# Patient Record
Sex: Male | Born: 1990 | Race: Black or African American | Hispanic: No | Marital: Single | Smoking: Current every day smoker
Health system: Southern US, Community
[De-identification: ages and names within clinical notes are randomized; demographics above are authoritative.]

---
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
## 2014-07-19 ENCOUNTER — Encounter (HOSPITAL_COMMUNITY): Payer: Self-pay | Admitting: Emergency Medicine

## 2014-07-19 ENCOUNTER — Emergency Department (HOSPITAL_COMMUNITY)
Admission: EM | Admit: 2014-07-19 | Discharge: 2014-07-19 | Disposition: A | Discharge status: Home / Self Care | Payer: Self-pay | Attending: Emergency Medicine | Admitting: Emergency Medicine

## 2014-07-19 DIAGNOSIS — J018 Other acute sinusitis: Secondary | ICD-10-CM | POA: Insufficient documentation

## 2014-07-19 DIAGNOSIS — M542 Cervicalgia: Secondary | ICD-10-CM | POA: Insufficient documentation

## 2014-07-19 DIAGNOSIS — H9209 Otalgia, unspecified ear: Secondary | ICD-10-CM | POA: Insufficient documentation

## 2014-07-19 DIAGNOSIS — J069 Acute upper respiratory infection, unspecified: Secondary | ICD-10-CM | POA: Insufficient documentation

## 2014-07-19 DIAGNOSIS — Z720 Tobacco use: Secondary | ICD-10-CM | POA: Insufficient documentation

## 2014-07-19 MED ORDER — AMOXICILLIN 500 MG PO CAPS: 500.0000 mg | ORAL_CAPSULE | Freq: Three times a day (TID) | ORAL | Status: DC

## 2014-07-19 MED ORDER — LORATADINE-PSEUDOEPHEDRINE ER 5-120 MG PO TB12: 1.0000 | ORAL_TABLET | Freq: Two times a day (BID) | ORAL | Status: DC

## 2014-07-19 MED ORDER — ACETAMINOPHEN-CODEINE #3 300-30 MG PO TABS
1.0000 | ORAL_TABLET | Freq: Four times a day (QID) | ORAL | Status: DC | PRN
Start: 2014-07-19 — End: 2015-11-30

## 2014-07-19 MED ORDER — PREDNISONE 10 MG PO TABS: ORAL_TABLET | ORAL | Status: DC

## 2014-07-19 NOTE — ED Notes (Signed)
Patient given discharge instruction, verbalized understand. Patient ambulatory out of the department.  

## 2014-07-19 NOTE — ED Provider Notes (Signed)
CSN: [id number]     Arrival date & time 07/19/14  0945 History  This chart was scribed for non-physician practitioner, Ivery Quale, PA-C working with Juliet Rude. Rubin Payor, MD found found by Gwenyth Ober, ED scribe. This patient was seen in room APA09/APA09 and the patient's care was started at 11:28 AM   Chief Complaint  Patient presents with  . Nasal Congestion   Patient is a 24 y.o. male presenting with URI. The history is provided by the patient. No language interpreter was used.  URI Presenting symptoms: congestion, ear pain, facial pain, fever and sore throat   Severity:  Moderate Onset quality:  Gradual Duration:  1 week Timing:  Constant Progression:  Unchanged Chronicity:  New Relieved by:  Nothing Ineffective treatments:  Decongestant Associated symptoms: headaches, neck pain and sinus pain     HPI Comments: Riley Ali is a 24 y.o. male with no chronic medical conditions who presents to the Emergency Department complaining of constant pressure-like head ache and sinus congestion that started 1 week ago. Pt states sore throat, neck pain, ear pain, greenish-yellow nasal drainage, fever of 36F, and 1 episode of diarrhea that occurred yesterday as associated symptoms. He has tried Mucinex and Advil with no relief. He denies recent head injury. Pt also denies vomiting and visual changes as associated symptoms.   History reviewed. No pertinent past medical history. History reviewed. No pertinent past surgical history. No family history on file. History  Substance Use Topics  . Smoking status: Current Every Day Smoker -- 0.50 packs/day    Types: Cigarettes  . Smokeless tobacco: Not on file  . Alcohol Use: Yes     Comment: rarely    Review of Systems  Constitutional: Positive for fever.  HENT: Positive for congestion, ear pain, sinus pressure and sore throat.   Eyes: Negative for visual disturbance.  Gastrointestinal: Positive for diarrhea. Negative for vomiting.   Musculoskeletal: Positive for neck pain.  Neurological: Positive for headaches.  All other systems reviewed and are negative.     Allergies  Review of patient's allergies indicates no known allergies.  Home Medications   Prior to Admission medications   Medication Sig Start Date End Date Taking? Authorizing Provider  GuaiFENesin (MUCINEX PO) Take 2 tablets by mouth every 12 (twelve) hours as needed (congestion).    Yes Historical Provider, MD   BP 112/87 mmHg  Pulse 98  Temp(Src) 98.5 F (36.9 C) (Oral)  Resp 18  Ht  (1.803 m)  Wt 170 lb (77.111 kg)  BMI 23.72 kg/m2  SpO2 98% Physical Exam  Constitutional: He is oriented to person, place, and time. He appears well-developed and well-nourished. No distress.  HENT:  Head: Normocephalic and atraumatic.  Mouth/Throat: Oropharynx is clear and moist. No oropharyngeal exudate.  Right ear occluded with cerumen; mild fluid behind TM on the left; mild increased swelling of the tonsils with craters; uvula is in the midline; nasal congestion present    Eyes: Pupils are equal, round, and reactive to light.  Neck: Neck supple.  Cardiovascular: Normal rate, regular rhythm and normal heart sounds.   Pulmonary/Chest: Effort normal and breath sounds normal.  Lungs clear; symmetrical rise and fall of the chest  Musculoskeletal: Normal range of motion. He exhibits no edema.  Neurological: He is alert and oriented to person, place, and time. No cranial nerve deficit.  Skin: Skin is warm and dry. No rash noted.  Psychiatric: He has a normal mood and affect. His behavior is normal.  Nursing  note and vitals reviewed.   ED Course  Procedures (including critical care time) DIAGNOSTIC STUDIES: Oxygen Saturation is 98% on RA, normal by my interpretation.    COORDINATION OF CARE: 11:16 AM Discussed treatment plan with pt at bedside and pt agreed to plan.   Labs Review Labs Reviewed - No data to display  Imaging Review No results  found.   EKG Interpretation None      MDM  No rash. No noted high fever No hemoptysis   Suspect sinusitis and uri  Pt to be treated with claritin D, prednisone, and tylenol codeine. Pt to f/u with PCP.   Final diagnoses:  None    *I have reviewed nursing notes, vital signs, and all appropriate lab and imaging results for this patient.** **I personally performed the services described in this documentation, which was scribed in my presence. The recorded information has been reviewed and is accurate.   Kathie Dike, PA-C 07/19/14 1215  Juliet Rude. Rubin Payor, MD 07/19/14 1553

## 2014-07-19 NOTE — Care Management Note (Signed)
ED/CM noted patient did not have health insurance and/or PCP listed in the computer.  Patient was given the [place] resource handout with information on the clinics, food pantries, and the handout for new health insurance sign-up. Pt was also given a Rx discount card. Patient expressed appreciation for information received. 

## 2014-07-19 NOTE — ED Notes (Signed)
PT c/o nasal congestion, ear tenderness and sinus pressure x1 week. PT last OTC medication was Mucinex last night. PT denies any chest pain or SOB.

## 2014-07-19 NOTE — Discharge Instructions (Signed)
Sinusitis °Sinusitis is redness, soreness, and puffiness (inflammation) of the air pockets in the bones of your face (sinuses). The redness, soreness, and puffiness can cause air and mucus to get trapped in your sinuses. This can allow germs to grow and cause an infection.  °HOME CARE  °· Drink enough fluids to keep your pee (urine) clear or pale yellow. °· Use a humidifier in your home. °· Run a hot shower to create steam in the bathroom. Sit in the bathroom with the door closed. Breathe in the steam 3-4 times a day. °· Put a warm, moist washcloth on your face 3-4 times a day, or as told by your doctor. °· Use salt water sprays (saline sprays) to wet the thick fluid in your nose. This can help the sinuses drain. °· Only take medicine as told by your doctor. °GET HELP RIGHT AWAY IF:  °· Your pain gets worse. °· You have very bad headaches. °· You are sick to your stomach (nauseous). °· You throw up (vomit). °· You are very sleepy (drowsy) all the time. °· Your face is puffy (swollen). °· Your vision changes. °· You have a stiff neck. °· You have trouble breathing. °MAKE SURE YOU:  °· Understand these instructions. °· Will watch your condition. °· Will get help right away if you are not doing well or get worse. °Document Released: 12/19/2007 Document Revised: 03/26/2012 Document Reviewed: 02/05/2012 °ExitCare® Patient Information ©2015 [employer]. This information is not intended to replace advice given to you by your health care provider. Make sure you discuss any questions you have with your health care provider. ° °Upper Respiratory Infection, Adult °An upper respiratory infection (URI) is also known as the common cold. It is often caused by a type of germ (virus). Colds are easily spread (contagious). You can pass it to others by kissing, coughing, sneezing, or drinking out of the same glass. Usually, you get better in 1 or 2 weeks.  °HOME CARE  °· Only take medicine as told by your doctor. °· Use a warm mist  humidifier or breathe in steam from a hot shower. °· Drink enough water and fluids to keep your pee (urine) clear or pale yellow. °· Get plenty of rest. °· Return to work when your temperature is back to normal or as told by your doctor. You may use a face mask and wash your hands to stop your cold from spreading. °GET HELP RIGHT AWAY IF:  °· After the first few days, you feel you are getting worse. °· You have questions about your medicine. °· You have chills, shortness of breath, or brown or red spit (mucus). °· You have yellow or brown snot (nasal discharge) or pain in the face, especially when you bend forward. °· You have a fever, puffy (swollen) neck, pain when you swallow, or white spots in the back of your throat. °· You have a bad headache, ear pain, sinus pain, or chest pain. °· You have a high-pitched whistling sound when you breathe in and out (wheezing). °· You have a lasting cough or cough up blood. °· You have sore muscles or a stiff neck. °MAKE SURE YOU:  °· Understand these instructions. °· Will watch your condition. °· Will get help right away if you are not doing well or get worse. °Document Released: 12/19/2007 Document Revised: 09/24/2011 Document Reviewed: 10/07/2013 °ExitCare® Patient Information ©2015 [employer]. This information is not intended to replace advice given to you by your health care provider. Make   sure you discuss any questions you have with your health care provider. ° °

## 2015-11-30 ENCOUNTER — Emergency Department (HOSPITAL_COMMUNITY)
Admission: EM | Admit: 2015-11-30 | Discharge: 2015-11-30 | Disposition: A | Discharge status: Home / Self Care | Payer: No Typology Code available for payment source | Attending: Emergency Medicine | Admitting: Emergency Medicine

## 2015-11-30 ENCOUNTER — Encounter (HOSPITAL_COMMUNITY): Payer: Self-pay

## 2015-11-30 ENCOUNTER — Emergency Department (HOSPITAL_COMMUNITY): Payer: No Typology Code available for payment source

## 2015-11-30 DIAGNOSIS — S46819A Strain of other muscles, fascia and tendons at shoulder and upper arm level, unspecified arm, initial encounter: Secondary | ICD-10-CM | POA: Insufficient documentation

## 2015-11-30 DIAGNOSIS — Y9389 Activity, other specified: Secondary | ICD-10-CM | POA: Diagnosis not present

## 2015-11-30 DIAGNOSIS — F17210 Nicotine dependence, cigarettes, uncomplicated: Secondary | ICD-10-CM | POA: Insufficient documentation

## 2015-11-30 DIAGNOSIS — Y92410 Unspecified street and highway as the place of occurrence of the external cause: Secondary | ICD-10-CM | POA: Insufficient documentation

## 2015-11-30 DIAGNOSIS — Y999 Unspecified external cause status: Secondary | ICD-10-CM | POA: Diagnosis not present

## 2015-11-30 DIAGNOSIS — S4990XA Unspecified injury of shoulder and upper arm, unspecified arm, initial encounter: Secondary | ICD-10-CM | POA: Diagnosis present

## 2015-11-30 MED ORDER — METHOCARBAMOL 500 MG PO TABS: 500.0000 mg | ORAL_TABLET | Freq: Three times a day (TID) | ORAL | Status: AC
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
## 2020-09-09 ENCOUNTER — Emergency Department (HOSPITAL_COMMUNITY)
Admission: EM | Admit: 2020-09-09 | Discharge: 2020-09-09 | Disposition: A | Discharge status: Home / Self Care | Payer: Self-pay | Attending: Emergency Medicine | Admitting: Emergency Medicine

## 2020-09-09 ENCOUNTER — Encounter (HOSPITAL_COMMUNITY): Payer: Self-pay | Admitting: *Deleted

## 2020-09-09 ENCOUNTER — Emergency Department (HOSPITAL_COMMUNITY): Payer: Self-pay

## 2020-09-09 ENCOUNTER — Other Ambulatory Visit: Payer: Self-pay

## 2020-09-09 DIAGNOSIS — M546 Pain in thoracic spine: Secondary | ICD-10-CM | POA: Insufficient documentation

## 2020-09-09 DIAGNOSIS — F17210 Nicotine dependence, cigarettes, uncomplicated: Secondary | ICD-10-CM | POA: Insufficient documentation

## 2020-09-09 DIAGNOSIS — M542 Cervicalgia: Secondary | ICD-10-CM | POA: Insufficient documentation

## 2020-09-09 MED ORDER — METHYLPREDNISOLONE 4 MG PO TBPK: ORAL_TABLET | ORAL | 0 refills | Status: AC

## 2020-09-09 MED ORDER — CYCLOBENZAPRINE HCL 10 MG PO TABS: 5.0000 mg | ORAL_TABLET | Freq: Two times a day (BID) | ORAL | 0 refills | Status: AC | PRN

## 2020-09-09 MED ORDER — METHYLPREDNISOLONE 4 MG PO TBPK: ORAL_TABLET | ORAL | 0 refills | Status: DC

## 2020-09-09 MED ORDER — CELECOXIB 200 MG PO CAPS: 200.0000 mg | ORAL_CAPSULE | Freq: Two times a day (BID) | ORAL | 0 refills | Status: AC

## 2020-09-09 MED ORDER — CYCLOBENZAPRINE HCL 10 MG PO TABS: 5.0000 mg | ORAL_TABLET | Freq: Two times a day (BID) | ORAL | 0 refills | Status: DC | PRN
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
## 2020-09-09 NOTE — Discharge Instructions (Addendum)
    I am discharging you with something for pain, a muscle relaxer and an antiinflammatory medication which should help if you have a disc issue.  Your CT scan showed no abnormalities.  If you are having worsening pain or weakness of the upper extremity you should follow-up with a neurosurgeon.  I have given you information to follow-up with this provider.  You should otherwise have an MRI of the neck if necessary.  Return to the emergency department for the reasons listed under "get help right away":  Contact a health care provider if: Your condition does not improve with treatment. Get help right away if: Your pain gets much worse and cannot be controlled with medicines. You have weakness or numbness in your hand, arm, face, or leg. You have a high fever. You have a stiff, rigid neck. You lose control of your bowels or your bladder (have incontinence). You have trouble with walking, balance, or speaking.

## 2020-09-09 NOTE — ED Notes (Addendum)
Entered room and introduced self to patient. Pt appears to be resting in chair, respirations are even and unlabored with equal chest rise and fall. Call bell within reach. Pt educated on call light use and hourly rounding, verbalized understanding and in agreement at this time. All questions and concerns voiced addressed. Refreshments offered and provided per patient request.  

## 2020-09-09 NOTE — ED Notes (Signed)
ED Provider at bedside. 

## 2020-09-09 NOTE — ED Triage Notes (Signed)
Back pain onset 5 days ago. History of same

## 2020-09-09 NOTE — ED Notes (Signed)
Patient transported to CT 

## 2021-04-25 IMAGING — CT CT CERVICAL SPINE W/O CM
3 of 4 series · 12 of 33 positions shown, 14 images · non-contrast
Comparison: X-ray cervical spine 11/30/2015.

CLINICAL DATA: Pt c/o right sided neck pain x 5 days. NKI

EXAM:
CT CERVICAL SPINE WITHOUT CONTRAST
TECHNIQUE: Multidetector CT imaging of the cervical spine was performed without
intravenous contrast. Multiplanar CT image reconstructions were also
generated.

[Series 5: sag bone · sagittal · 0.27mm/px · 5 of 61 slices shown, 6 images]
[im 21/61  bone]
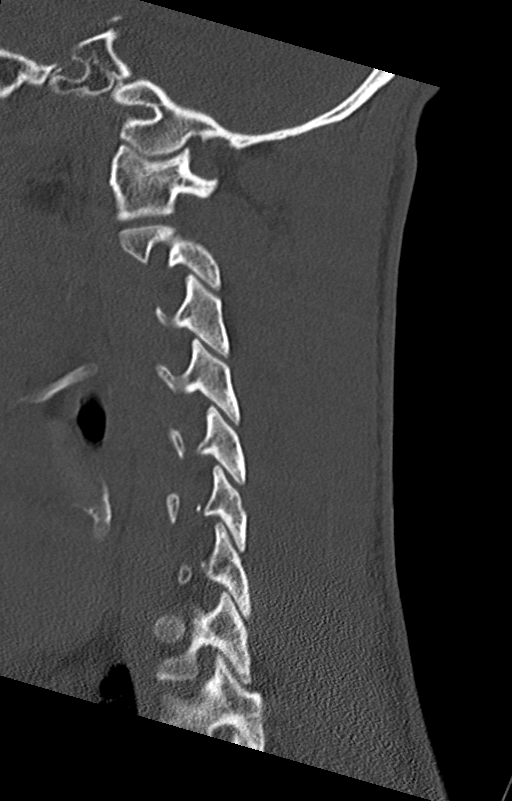
[im 26/61  bone]
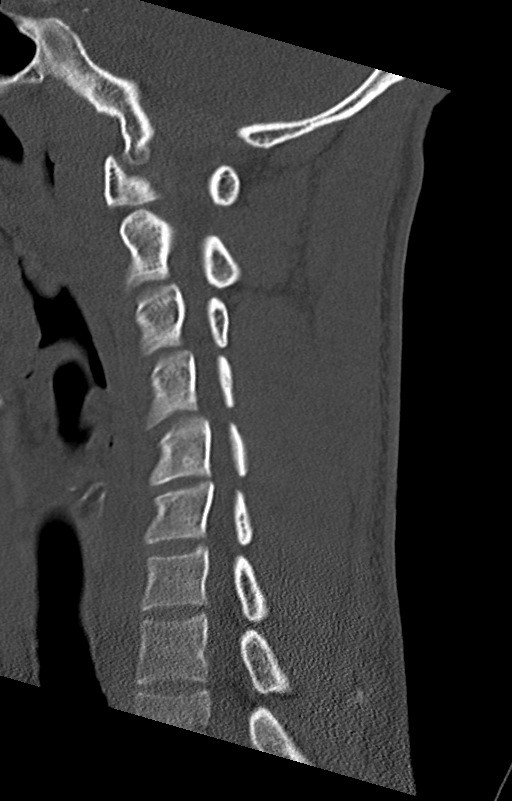
[im 31/61  soft-tissue]
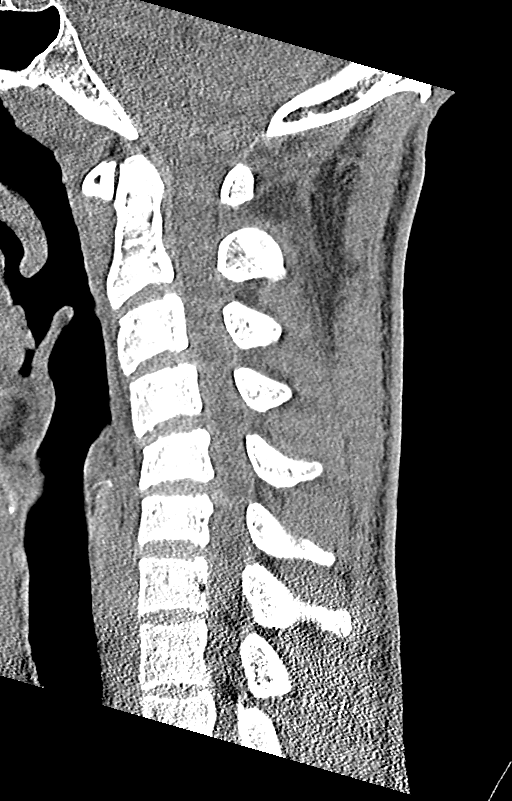
[im 31/61  bone]
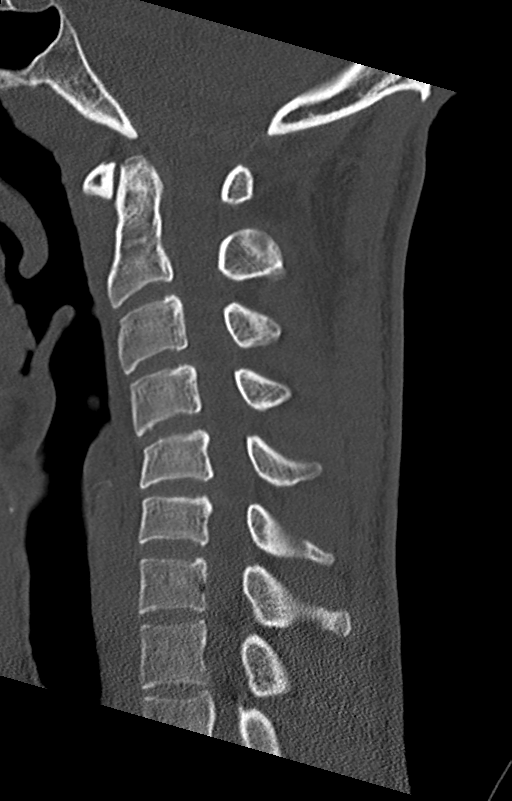
[im 36/61  bone]
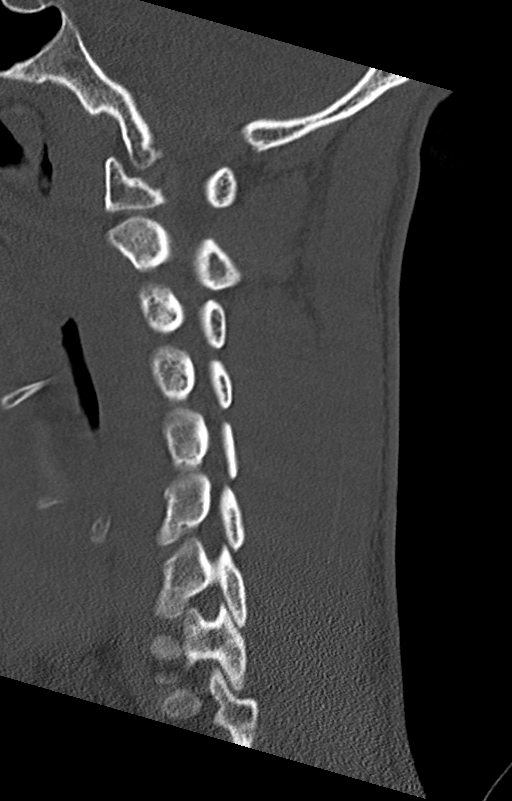
[im 41/61  bone]
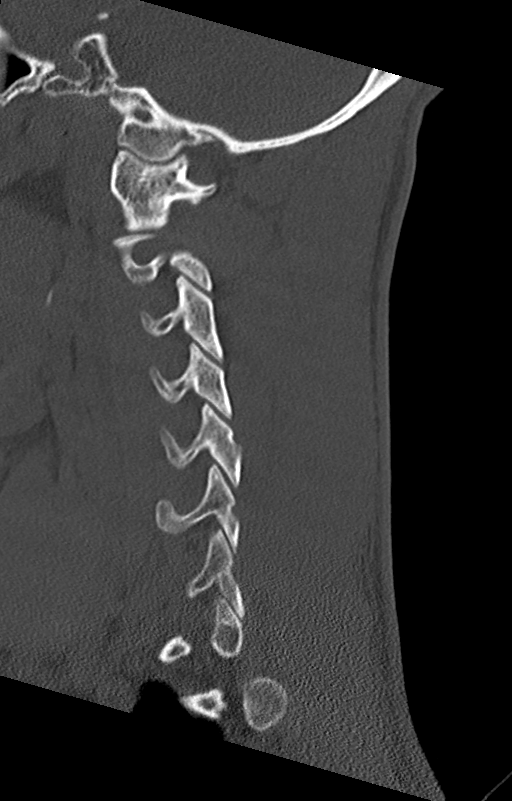

[Series 6: cor bone · coronal · 0.23mm/px · 3 of 61 slices shown]
[im 13/61  bone]
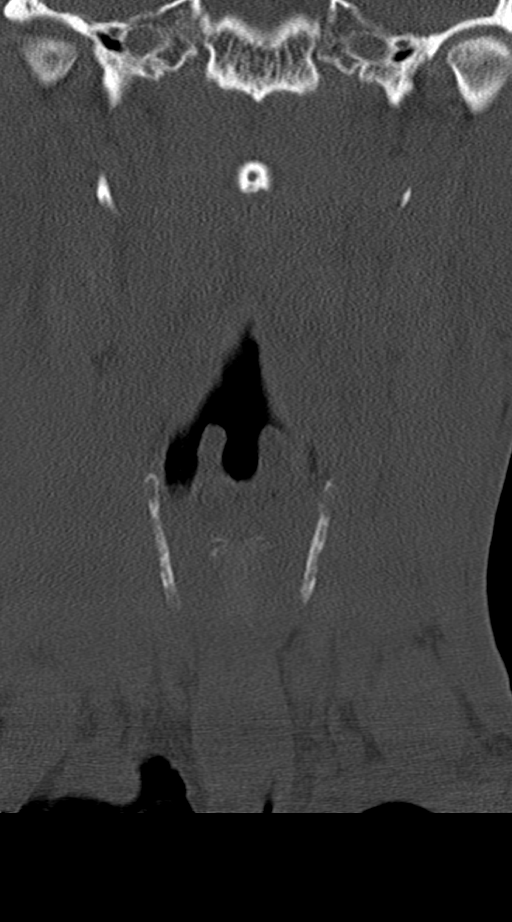
[im 25/61  bone]
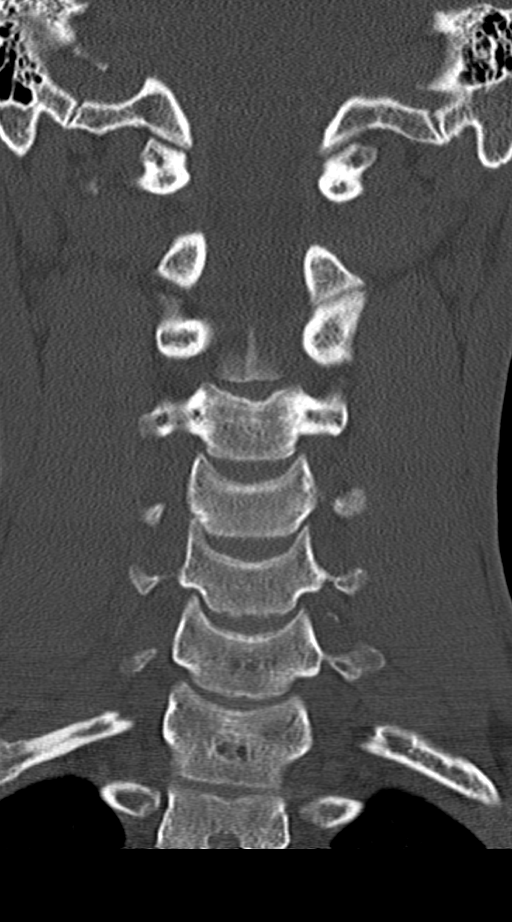
[im 37/61  bone]
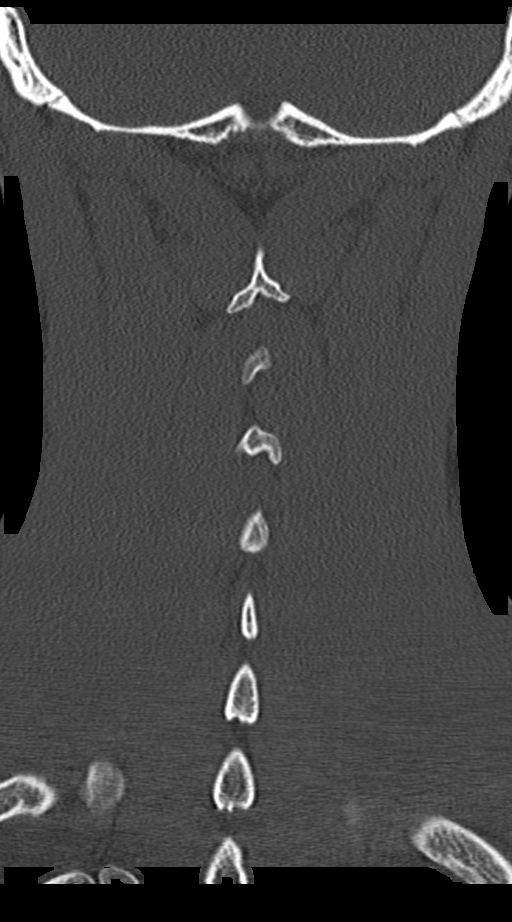

[Series 7: orthogonal axials · axial · 0.21mm/px · z∈[-102,-2]mm · 4 of 88 slices shown, 5 images]
[im 15/88  soft-tissue]
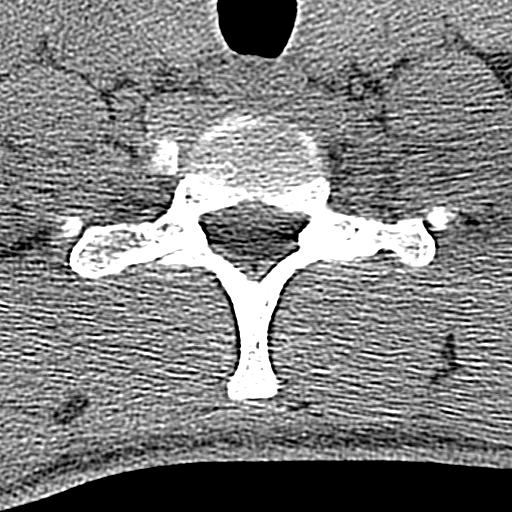
[im 15/88  bone]
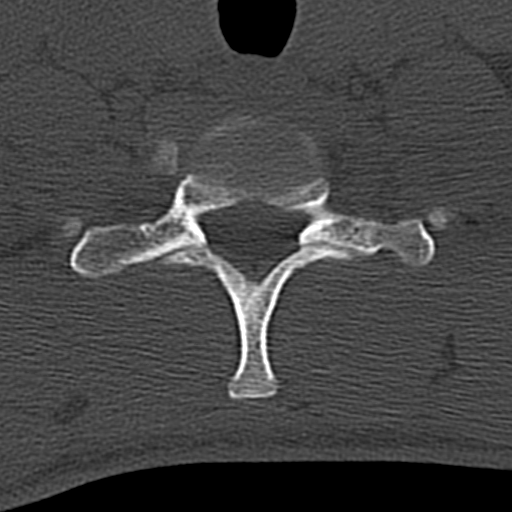
[im 30/88  bone]
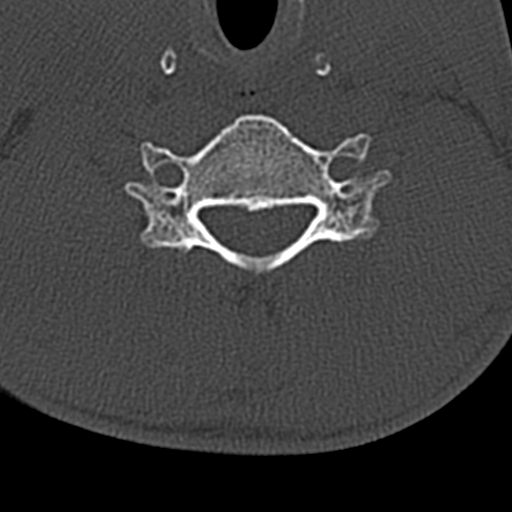
[im 59/88  bone]
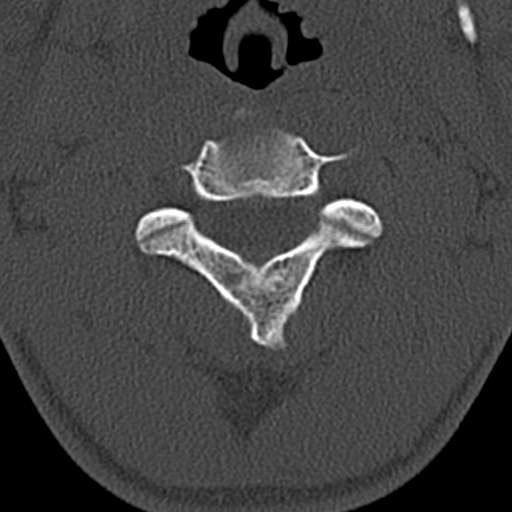
[im 73/88  bone]
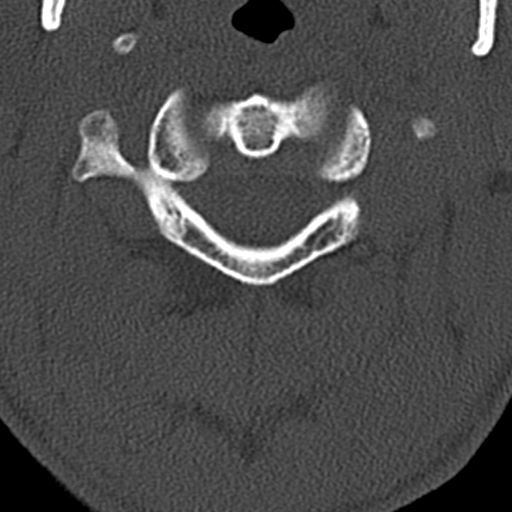

[12 of 33 positions shown; findings below may reference images not displayed]

FINDINGS: Alignment: Reversal of the normal cervical lordosis likely due to
positioning. Otherwise normal.

Skull base and vertebrae: No acute fracture. No aggressive appearing
focal osseous lesion or focal pathologic process. No severe osseous
neural foraminal or central canal stenosis.

Soft tissues and spinal canal: No prevertebral fluid or swelling. No
visible canal hematoma.

Upper chest: Right apex paraseptal emphysematous changes.

Other: None.
IMPRESSION: No acute fracture or traumatic listhesis of the cervical spine.

## 2022-05-29 ENCOUNTER — Other Ambulatory Visit: Payer: Self-pay | Admitting: Nurse Practitioner

## 2022-05-29 ENCOUNTER — Ambulatory Visit: Payer: Self-pay

## 2022-05-29 DIAGNOSIS — M79644 Pain in right finger(s): Secondary | ICD-10-CM

## 2022-06-23 ENCOUNTER — Encounter (HOSPITAL_COMMUNITY): Payer: Self-pay | Admitting: *Deleted

## 2022-06-23 ENCOUNTER — Other Ambulatory Visit: Payer: Self-pay

## 2022-06-23 ENCOUNTER — Emergency Department (HOSPITAL_COMMUNITY): Payer: Self-pay

## 2022-06-23 ENCOUNTER — Emergency Department (HOSPITAL_COMMUNITY)
Admission: EM | Admit: 2022-06-23 | Discharge: 2022-06-23 | Disposition: A | Discharge status: Home / Self Care | Payer: Self-pay | Attending: Emergency Medicine | Admitting: Emergency Medicine

## 2022-06-23 DIAGNOSIS — Z20822 Contact with and (suspected) exposure to covid-19: Secondary | ICD-10-CM | POA: Insufficient documentation

## 2022-06-23 DIAGNOSIS — J111 Influenza due to unidentified influenza virus with other respiratory manifestations: Secondary | ICD-10-CM

## 2022-06-23 DIAGNOSIS — J101 Influenza due to other identified influenza virus with other respiratory manifestations: Secondary | ICD-10-CM | POA: Insufficient documentation

## 2022-06-23 LAB — RESP PANEL BY RT-PCR (FLU A&B, COVID) ARPGX2
Influenza A by PCR: NEGATIVE
Influenza B by PCR: POSITIVE — AB
SARS Coronavirus 2 by RT PCR: NEGATIVE

## 2022-06-23 MED ORDER — ACETAMINOPHEN 325 MG PO TABS
650.0000 mg | ORAL_TABLET | Freq: Once | ORAL | Status: AC
  Administered 2022-06-23: 650 mg via ORAL
  Filled 2022-06-23: qty 2

## 2022-06-23 NOTE — ED Provider Notes (Signed)
[facility] EMERGENCY DEPARTMENT Provider Note   CSN: [id number] Arrival date & time: 06/23/22  1029     History  Chief Complaint  Patient presents with   Fever    Riley Ali is a 31 y.o. male with no significant past medical history presenting to the emergency department for evaluation of cough and fever.  Patient states he has has increased sleepiness and decreased appetite in the last 2 days.  This morning he checked his temperature which was found to be 103.  Patient reports some cough with nasal congestion and pain in the back of his head.Marland Kitchen  He is not sure if he is around anyone who is sick.  Endorses diarrhea, nausea without vomiting.  Denies chest pain, shortness of breath, urinary symptoms, rash.  No recent travel.   Fever    History reviewed. No pertinent past medical history. History reviewed. No pertinent surgical history.   Home Medications Prior to Admission medications   Medication Sig Start Date End Date Taking? Authorizing Provider  celecoxib (CELEBREX) 200 MG capsule Take 1 capsule (200 mg total) by mouth 2 (two) times daily. 09/09/20   Arthor Captain, PA-C  cyclobenzaprine (FLEXERIL) 10 MG tablet Take 0.5-1 tablets (5-10 mg total) by mouth 2 (two) times daily as needed for muscle spasms. 09/09/20   Arthor Captain, PA-C  methocarbamol (ROBAXIN) 500 MG tablet Take 1 tablet (500 mg total) by mouth 3 (three) times daily. 11/30/15   Ivery Quale, PA-C  methylPREDNISolone (MEDROL DOSEPAK) 4 MG TBPK tablet Use as directed 09/09/20   Arthor Captain, PA-C      Allergies    Patient has no known allergies.    Review of Systems   Review of Systems  Constitutional:  Positive for fever.    Physical Exam Updated Vital Signs BP 121/73 (BP Location: Right Arm)   Pulse 81   Temp (!) 102.2 F (39 C) (Oral)   Resp 17   Ht 5\' 11"  (1.803 m)   Wt 77.1 kg   SpO2 97%   BMI 23.71 kg/m  Physical Exam Vitals and nursing note reviewed.  Constitutional:       Appearance: Normal appearance.  HENT:     Head: Normocephalic and atraumatic.     Mouth/Throat:     Mouth: Mucous membranes are moist.  Eyes:     General: No scleral icterus. Cardiovascular:     Rate and Rhythm: Normal rate and regular rhythm.     Pulses: Normal pulses.     Heart sounds: Normal heart sounds.  Pulmonary:     Effort: Pulmonary effort is normal.     Breath sounds: Normal breath sounds.  Abdominal:     General: Abdomen is flat.     Palpations: Abdomen is soft.     Tenderness: There is no abdominal tenderness.  Musculoskeletal:        General: No deformity.  Skin:    General: Skin is warm.     Findings: No rash.  Neurological:     General: No focal deficit present.     Mental Status: He is alert.  Psychiatric:        Mood and Affect: Mood normal.     ED Results / Procedures / Treatments   Labs (all labs ordered are listed, but only abnormal results are displayed) Labs Reviewed  RESP PANEL BY RT-PCR (FLU A&B, COVID) ARPGX2 - Abnormal; Notable for the following components:      Result Value   Influenza B by PCR POSITIVE (*)  All other components within normal limits    EKG None  Radiology DG Chest 2 View  Result Date: 06/23/2022 CLINICAL DATA:  Fever and cough.  Body ache. EXAM: CHEST - 2 VIEW COMPARISON:  None Available. FINDINGS: Heart size is normal. Mediastinal shadows are normal. The lungs are clear. No bronchial thickening. No infiltrate, mass, effusion or collapse. Pulmonary vascularity is normal. No bony abnormality. IMPRESSION: Normal chest. Electronically Signed   By: Paulina Fusi M.D.   On: 06/23/2022 11:30    Procedures Procedures    Medications Ordered in ED Medications  acetaminophen (TYLENOL) tablet 650 mg (650 mg Oral Given 06/23/22 1109)    ED Course/ Medical Decision Making/ A&P                           Medical Decision Making Amount and/or Complexity of Data Reviewed Radiology: ordered.  Risk OTC drugs.   This patient  presents to the ED for fever, cough, this involves an extensive number of treatment options, and is a complaint that carries with a high risk of complications and morbidity.  The differential diagnosis includes flu, COVID, RSV, strep pharyngitis, bronchiolitis, bronchitis, infectious etiology.  This is not an exhaustive list.  Comorbidities that complicate the patient evaluation See HPI  Social determinants of health NA  Additional history obtained: External records from outside source obtained and reviewed including: Chart review including previous notes, labs, imaging.  Cardiac monitoring/EKG: The patient was maintained on a cardiac monitor.  I personally reviewed and interpreted the cardiac monitor which showed an underlying rhythm of: Sinus rhythm.  Lab tests: I ordered and personally interpreted labs.  The pertinent results include: Respiratory panel positive for influenza B.  Imaging studies: I ordered imaging studies including: Chest x-ray negative. I personally reviewed, interpreted imaging and agree with the radiologist's interpretations.  Problem list/ ED course/ Critical interventions/ Medical management: HPI: See above Vital signs significant for temperature 102.2 otherwise within normal range and stable throughout visit. Laboratory/imaging studies significant for: See above. On physical examination, patient is afebrile and appears in no acute distress.  Heart sounds normal.  Lungs are clear, no wheezes or rales.  Respiratory panel positive for influenza B.  Chest x-ray negative.  Based on patient's clinical presentations and laboratory/imaging studies I suspect influenza B.  Pneumonia is unlikely per chest x-ray.  Strep is unlikely as patient has no tonsillar exudates or anterior cervical lymphadenopathy.  Advised patient to continue to take Tylenol/ibuprofen for fever.  Take OTC Mucinex for symptom relief.  Follow-up with primary care physician for further evaluation and  management.  Return to the ER if new or worsening symptoms. I have reviewed the patient home medicines and have made adjustments as needed.  Consultations obtained:  Disposition Continued outpatient therapy. Follow-up with PCP  recommended for reevaluation of symptoms. Treatment plan discussed with patient.  Pt acknowledged understanding was agreeable to the plan. Worrisome signs and symptoms were discussed with patient, and patient acknowledged understanding to return to the ED if they noticed these signs and symptoms. Patient was stable upon discharge.   This chart was dictated using voice recognition software.  Despite best efforts to proofread,  errors can occur which can change the documentation meaning.          Final Clinical Impression(s) / ED Diagnoses Final diagnoses:  Flu    Rx / DC Orders ED Discharge Orders     None  Jeanelle Malling, PA 06/23/22 2120    Jacalyn Lefevre, MD 06/24/22 [phone]

## 2022-06-23 NOTE — ED Triage Notes (Signed)
Pt with fever x 2 days 102.8 highest at home, sleeping more than usual.  Took advil PTA about an hour ago per pt.  + cough, + HA, + body aches, bilateral ear pain

## 2022-06-23 NOTE — Discharge Instructions (Addendum)
You can take OTC Mucinex for symptom relief. Take tylenol/ibuprofen for pain. I recommend close follow-up with PCP for reevaluation.  Please do not hesitate to return to emergency department if worrisome signs symptoms we discussed become apparent.
